# Patient Record
Sex: Female | Born: 1965 | Race: White | Hispanic: No | Marital: Single | Smoking: Current every day smoker
Health system: Southern US, Community
[De-identification: ages and names within clinical notes are randomized; demographics above are authoritative.]

## PROBLEM LIST (undated history)

## (undated) DIAGNOSIS — K219 Gastro-esophageal reflux disease without esophagitis: Secondary | ICD-10-CM

## (undated) DIAGNOSIS — F419 Anxiety disorder, unspecified: Secondary | ICD-10-CM

## (undated) DIAGNOSIS — K5900 Constipation, unspecified: Secondary | ICD-10-CM

## (undated) HISTORY — DX: Gastro-esophageal reflux disease without esophagitis: K21.9

## (undated) HISTORY — DX: Constipation, unspecified: K59.00

## (undated) HISTORY — DX: Anxiety disorder, unspecified: F41.9

---

## 1995-10-26 HISTORY — PX: APPENDECTOMY: SHX54

## 1995-10-26 HISTORY — PX: OVARY SURGERY: SHX727

## 2001-04-21 ENCOUNTER — Encounter: Payer: Self-pay | Admitting: Emergency Medicine

## 2001-04-21 ENCOUNTER — Emergency Department (HOSPITAL_COMMUNITY): Admission: EM | Admit: 2001-04-21 | Discharge: 2001-04-21 | Payer: Self-pay | Admitting: Emergency Medicine

## 2001-07-01 ENCOUNTER — Ambulatory Visit (HOSPITAL_COMMUNITY): Admission: RE | Admit: 2001-07-01 | Discharge: 2001-07-01 | Payer: Self-pay | Admitting: Pulmonary Disease

## 2001-07-13 ENCOUNTER — Ambulatory Visit (HOSPITAL_COMMUNITY): Admission: RE | Admit: 2001-07-13 | Discharge: 2001-07-13 | Payer: Self-pay | Admitting: Orthopaedic Surgery

## 2001-07-13 ENCOUNTER — Encounter: Payer: Self-pay | Admitting: Orthopaedic Surgery

## 2001-12-13 ENCOUNTER — Encounter: Payer: Self-pay | Admitting: Orthopaedic Surgery

## 2001-12-13 ENCOUNTER — Ambulatory Visit (HOSPITAL_COMMUNITY): Admission: RE | Admit: 2001-12-13 | Discharge: 2001-12-13 | Payer: Self-pay | Admitting: Orthopaedic Surgery

## 2002-01-04 ENCOUNTER — Other Ambulatory Visit: Admission: RE | Admit: 2002-01-04 | Discharge: 2002-01-04 | Payer: Self-pay | Admitting: Internal Medicine
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
## 2003-11-12 ENCOUNTER — Ambulatory Visit (HOSPITAL_COMMUNITY): Admission: RE | Admit: 2003-11-12 | Discharge: 2003-11-12 | Payer: Self-pay | Admitting: Pulmonary Disease

## 2009-02-22 ENCOUNTER — Emergency Department (HOSPITAL_COMMUNITY): Admission: EM | Admit: 2009-02-22 | Discharge: 2009-02-22 | Payer: Self-pay | Admitting: Emergency Medicine

## 2009-08-11 ENCOUNTER — Emergency Department (HOSPITAL_COMMUNITY): Admission: EM | Admit: 2009-08-11 | Discharge: 2009-08-11 | Payer: Self-pay | Admitting: Emergency Medicine

## 2016-07-02 ENCOUNTER — Other Ambulatory Visit (HOSPITAL_COMMUNITY)
Admission: RE | Admit: 2016-07-02 | Discharge: 2016-07-02 | Disposition: A | Discharge status: Home / Self Care | Payer: 59 | Source: Ambulatory Visit | Attending: Adult Health | Admitting: Adult Health

## 2016-07-02 ENCOUNTER — Encounter: Payer: Self-pay | Admitting: Adult Health

## 2016-07-02 ENCOUNTER — Ambulatory Visit (INDEPENDENT_AMBULATORY_CARE_PROVIDER_SITE_OTHER): Payer: 59 | Admitting: Adult Health

## 2016-07-02 VITALS — BP 120/80 | HR 86 | Ht 65.0 in | Wt 133.0 lb

## 2016-07-02 DIAGNOSIS — Z01411 Encounter for gynecological examination (general) (routine) with abnormal findings: Secondary | ICD-10-CM

## 2016-07-02 DIAGNOSIS — K5900 Constipation, unspecified: Secondary | ICD-10-CM | POA: Diagnosis not present

## 2016-07-02 DIAGNOSIS — Z01419 Encounter for gynecological examination (general) (routine) without abnormal findings: Secondary | ICD-10-CM | POA: Insufficient documentation

## 2016-07-02 DIAGNOSIS — Z1151 Encounter for screening for human papillomavirus (HPV): Secondary | ICD-10-CM | POA: Diagnosis present

## 2016-07-02 DIAGNOSIS — Z1211 Encounter for screening for malignant neoplasm of colon: Secondary | ICD-10-CM

## 2016-07-02 DIAGNOSIS — R1011 Right upper quadrant pain: Secondary | ICD-10-CM | POA: Diagnosis not present

## 2016-07-02 DIAGNOSIS — N951 Menopausal and female climacteric states: Secondary | ICD-10-CM

## 2016-07-02 LAB — HEMOCCULT GUIAC POC 1CARD (OFFICE): FECAL OCCULT BLD: NEGATIVE

## 2016-07-02 MED ORDER — POLYETHYLENE GLYCOL 3350 17 G PO PACK: 17.0000 g | PACK | Freq: Every day | ORAL | 0 refills | Status: DC

## 2016-07-02 MED ORDER — OMEPRAZOLE 20 MG PO CPDR: 20.0000 mg | DELAYED_RELEASE_CAPSULE | Freq: Two times a day (BID) | ORAL | 3 refills | Status: DC

## 2016-07-02 NOTE — Progress Notes (Signed)
Patient ID: Carmen Kelley, female   DOB: 03-13-1966, 50 y.o.   MRN: [id number] History of Present Illness:  Carmen Kelley is a 50 year old white female, new to this practice in for a well woman gyn exam and pap.She is having irregular periods, only 3 this year and some hot flashes, constipation and pin under right ribs at times.She has taken zantac and that helps some.Has gained some weight, esp mid section.  Current Medications, Allergies, Past Medical History, Past Surgical History, Family History and Social History were reviewed in [employer] record.     Review of Systems:  Patient denies any headaches, hearing loss, fatigue, blurred vision, shortness of breath, chest pain, problems with urination, or intercourse(not currently having sex). No joint pain or mood swings. See HPI for positives.   Physical Exam:BP 120/80 (BP Location: Left Arm, Patient Position: Sitting, Cuff Size: Normal)   Pulse 86   Ht 5\' 5"  (1.651 m)   Wt 133 lb (60.3 kg)   BMI 22.13 kg/m  General:  Well developed, well nourished, no acute distress Skin:  Warm and dry Neck:  Midline trachea, normal thyroid, good ROM, no lymphadenopathy Lungs; Clear to auscultation bilaterally Breast:  No dominant palpable mass, retraction, or nipple discharge Cardiovascular: Regular rate and rhythm Abdomen:  Soft, non tender, no hepatosplenomegaly Pelvic:  External genitalia is normal in appearance, no lesions.  The vagina is normal in appearance. Urethra has no lesions or masses. The cervix is smooth, pap with HPV performed.  Uterus is felt to be normal size, shape, and contour.  No adnexal masses or tenderness noted.Bladder is non tender, no masses felt. Rectal: Good sphincter tone, no polyps, or hemorrhoids felt.  Hemoccult negative. Extremities/musculoskeletal:  No swelling or varicosities noted, no clubbing or cyanosis Psych:  No mood changes, alert and cooperative,seems happy Discussed menopause  changes.  Impression: 1. Encounter for gynecological examination with Papanicolaou smear of cervix   2. Peri-menopausal   3. Constipation, unspecified constipation type   4. RUQ pain       Plan: Check CBC,CMP,TSH and lipids,A1c and vitamin D and H pylori,will talk next week, may get [place] to assess pain Take miralax daily Rx prilosec 20 mg 1 bid #60 with 3 refills Physical in 1 year, pap in 3 if normal Mammogram now and yearly Colonoscopy at 42 Review handout on peri menopause and menopause

## 2016-07-02 NOTE — Patient Instructions (Signed)
Menopause Menopause is the normal time of life when menstrual periods stop completely. Menopause is complete when you have missed 12 consecutive menstrual periods. It usually occurs between the ages of 48 years and 55 years. Very rarely does a woman develop menopause before the age of 40 years. At menopause, your ovaries stop producing the female hormones estrogen and progesterone. This can cause undesirable symptoms and also affect your health. Sometimes the symptoms may occur 4-5 years before the menopause begins. There is no relationship between menopause and:  Oral contraceptives.  Number of children you had.  Race.  The age your menstrual periods started (menarche). Heavy smokers and very thin women may develop menopause earlier in life. CAUSES  The ovaries stop producing the female hormones estrogen and progesterone.  Other causes include:  Surgery to remove both ovaries.  The ovaries stop functioning for no known reason.  Tumors of the pituitary gland in the brain.  Medical disease that affects the ovaries and hormone production.  Radiation treatment to the abdomen or pelvis.  Chemotherapy that affects the ovaries. SYMPTOMS   Hot flashes.  Night sweats.  Decrease in sex drive.  Vaginal dryness and thinning of the vagina causing painful intercourse.  Dryness of the skin and developing wrinkles.  Headaches.  Tiredness.  Irritability.  Memory problems.  Weight gain.  Bladder infections.  Hair growth of the face and chest.  Infertility. More serious symptoms include:  Loss of bone (osteoporosis) causing breaks (fractures).  Depression.  Hardening and narrowing of the arteries (atherosclerosis) causing heart attacks and strokes. DIAGNOSIS   When the menstrual periods have stopped for 12 straight months.  Physical exam.  Hormone studies of the blood. TREATMENT  There are many treatment choices and nearly as many questions about them. The  decisions to treat or not to treat menopausal changes is an individual choice made with your health care provider. Your health care provider can discuss the treatments with you. Together, you can decide which treatment will work best for you. Your treatment choices may include:   Hormone therapy (estrogen and progesterone).  Non-hormonal medicines.  Treating the individual symptoms with medicine (for example antidepressants for depression).  Herbal medicines that may help specific symptoms.  Counseling by a psychiatrist or psychologist.  Group therapy.  Lifestyle changes including:  Eating healthy.  Regular exercise.  Limiting caffeine and alcohol.  Stress management and meditation.  No treatment. HOME CARE INSTRUCTIONS   Take the medicine your health care provider gives you as directed.  Get plenty of sleep and rest.  Exercise regularly.  Eat a diet that contains calcium (good for the bones) and soy products (acts like estrogen hormone).  Avoid alcoholic beverages.  Do not smoke.  If you have hot flashes, dress in layers.  Take supplements, calcium, and vitamin D to strengthen bones.  You can use over-the-counter lubricants or moisturizers for vaginal dryness.  Group therapy is sometimes very helpful.  Acupuncture may be helpful in some cases. SEEK MEDICAL CARE IF:   You are not sure you are in menopause.  You are having menopausal symptoms and need advice and treatment.  You are still having menstrual periods after age 55 years.  You have pain with intercourse.  Menopause is complete (no menstrual period for 12 months) and you develop vaginal bleeding.  You need a referral to a specialist (gynecologist, psychiatrist, or psychologist) for treatment. SEEK IMMEDIATE MEDICAL CARE IF:   You have severe depression.  You have excessive vaginal bleeding.    You fell and think you have a broken bone.  You have pain when you urinate.  You develop leg or  chest pain.  You have a fast pounding heart beat (palpitations).  You have severe headaches.  You develop vision problems.  You feel a lump in your breast.  You have abdominal pain or severe indigestion.   This information is not intended to replace advice given to you by your health care provider. Make sure you discuss any questions you have with your health care provider.   Document Released: 01/01/2004 Document Revised: 06/13/2013 Document Reviewed: 05/10/2013 Elsevier Interactive Patient Education 2016 [place] is the time when your body begins to move into the menopause (no menstrual period for 12 straight months). It is a natural process. Perimenopause can begin 2-8 years before the menopause and usually lasts for 1 year after the menopause. During this time, your ovaries may or may not produce an egg. The ovaries vary in their production of estrogen and progesterone hormones each month. This can cause irregular menstrual periods, difficulty getting pregnant, vaginal bleeding between periods, and uncomfortable symptoms. CAUSES  Irregular production of the ovarian hormones, estrogen and progesterone, and not ovulating every month.  Other causes include:  Tumor of the pituitary gland in the brain.  Medical disease that affects the ovaries.  Radiation treatment.  Chemotherapy.  Unknown causes.  Heavy smoking and excessive alcohol intake can bring on perimenopause sooner. SIGNS AND SYMPTOMS   Hot flashes.  Night sweats.  Irregular menstrual periods.  Decreased sex drive.  Vaginal dryness.  Headaches.  Mood swings.  Depression.  Memory problems.  Irritability.  Tiredness.  Weight gain.  Trouble getting pregnant.  The beginning of losing bone cells (osteoporosis).  The beginning of hardening of the arteries (atherosclerosis). DIAGNOSIS  Your health care provider will make a diagnosis by analyzing your age, menstrual  history, and symptoms. He or she will do a physical exam and note any changes in your body, especially your female organs. Female hormone tests may or may not be helpful depending on the amount of female hormones you produce and when you produce them. However, other hormone tests may be helpful to rule out other problems. TREATMENT  In some cases, no treatment is needed. The decision on whether treatment is necessary during the perimenopause should be made by you and your health care provider based on how the symptoms are affecting you and your lifestyle. Various treatments are available, such as:  Treating individual symptoms with a specific medicine for that symptom.  Herbal medicines that can help specific symptoms.  Counseling.  Group therapy. HOME CARE INSTRUCTIONS   Keep track of your menstrual periods (when they occur, how heavy they are, how long between periods, and how long they last) as well as your symptoms and when they started.  Only take over-the-counter or prescription medicines as directed by your health care provider.  Sleep and rest.  Exercise.  Eat a diet that contains calcium (good for your bones) and soy (acts like the estrogen hormone).  Do not smoke.  Avoid alcoholic beverages.  Take vitamin supplements as recommended by your health care provider. Taking vitamin E may help in certain cases.  Take calcium and vitamin D supplements to help prevent bone loss.  Group therapy is sometimes helpful.  Acupuncture may help in some cases. SEEK MEDICAL CARE IF:   You have questions about any symptoms you are having.  You need a referral to a specialist (gynecologist,  psychiatrist, or psychologist). SEEK IMMEDIATE MEDICAL CARE IF:   You have vaginal bleeding.  Your period lasts longer than 8 days.  Your periods are recurring sooner than 21 days.  You have bleeding after intercourse.  You have severe depression.  You have pain when you urinate.  You  have severe headaches.  You have vision problems.   This information is not intended to replace advice given to you by your health care provider. Make sure you discuss any questions you have with your health care provider.   Document Released: 11/18/2004 Document Revised: 11/01/2014 Document Reviewed: 05/10/2013 Elsevier Interactive Patient Education [employer]. Physical in 1 year Mammogram now  Will talk next week

## 2016-07-03 LAB — CBC
Hematocrit: 41.2 % (ref 34.0–46.6)
Hemoglobin: 14.1 g/dL (ref 11.1–15.9)
MCH: 32.1 pg (ref 26.6–33.0)
MCHC: 34.2 g/dL (ref 31.5–35.7)
MCV: 94 fL (ref 79–97)
PLATELETS: 314 10*3/uL (ref 150–379)
RBC: 4.39 x10E6/uL (ref 3.77–5.28)
RDW: 12.6 % (ref 12.3–15.4)
WBC: 10.2 10*3/uL (ref 3.4–10.8)

## 2016-07-03 LAB — LIPID PANEL
CHOLESTEROL TOTAL: 203 mg/dL — AB (ref 100–199)
Chol/HDL Ratio: 3.3 ratio units (ref 0.0–4.4)
HDL: 62 mg/dL (ref >39)
LDL Calculated: 124 mg/dL — ABNORMAL HIGH (ref 0–99)
TRIGLYCERIDES: 86 mg/dL (ref 0–149)
VLDL Cholesterol Cal: 17 mg/dL (ref 5–40)

## 2016-07-03 LAB — COMPREHENSIVE METABOLIC PANEL
A/G RATIO: 1.7 (ref 1.2–2.2)
ALT: 16 IU/L (ref 0–32)
AST: 25 IU/L (ref 0–40)
Albumin: 4.4 g/dL (ref 3.5–5.5)
Alkaline Phosphatase: 81 IU/L (ref 39–117)
BILIRUBIN TOTAL: 0.3 mg/dL (ref 0.0–1.2)
BUN/Creatinine Ratio: 17 (ref 9–23)
BUN: 11 mg/dL (ref 6–24)
CALCIUM: 9.9 mg/dL (ref 8.7–10.2)
CHLORIDE: 101 mmol/L (ref 96–106)
CO2: 23 mmol/L (ref 18–29)
Creatinine, Ser: 0.66 mg/dL (ref 0.57–1.00)
GFR calc Af Amer: 120 mL/min/{1.73_m2} (ref >59)
GFR calc non Af Amer: 104 mL/min/{1.73_m2} (ref >59)
GLUCOSE: 88 mg/dL (ref 65–99)
Globulin, Total: 2.6 g/dL (ref 1.5–4.5)
POTASSIUM: 4.9 mmol/L (ref 3.5–5.2)
Sodium: 141 mmol/L (ref 134–144)
Total Protein: 7 g/dL (ref 6.0–8.5)

## 2016-07-03 LAB — TSH: TSH: 1.98 u[IU]/mL (ref 0.450–4.500)

## 2016-07-03 LAB — VITAMIN D 25 HYDROXY (VIT D DEFICIENCY, FRACTURES): VIT D 25 HYDROXY: 66.4 ng/mL (ref 30.0–100.0)

## 2016-07-03 LAB — H. PYLORI ANTIBODY, IGG: H PYLORI IGG: 7.8 U/mL — AB (ref 0.0–0.8)

## 2016-07-03 LAB — HEMOGLOBIN A1C
Est. average glucose Bld gHb Est-mCnc: 97 mg/dL
Hgb A1c MFr Bld: 5 % (ref 4.8–5.6)

## 2016-07-05 LAB — CYTOLOGY - PAP

## 2016-07-07 ENCOUNTER — Telehealth: Payer: Self-pay | Admitting: Adult Health

## 2016-07-07 NOTE — Telephone Encounter (Signed)
Left message to call me tomorrow about labs  

## 2016-07-08 ENCOUNTER — Telehealth: Payer: Self-pay | Admitting: Adult Health

## 2016-07-08 MED ORDER — AMOXICILL-CLARITHRO-LANSOPRAZ PO MISC: Freq: Two times a day (BID) | ORAL | 0 refills | Status: DC

## 2016-07-08 MED ORDER — METRONIDAZOLE 500 MG PO TABS: 500.0000 mg | ORAL_TABLET | Freq: Two times a day (BID) | ORAL | 0 refills | Status: DC

## 2016-07-08 NOTE — Telephone Encounter (Signed)
Pt aware +H pylori 7.8. Other labs look good, and pap negative with -HPV but +BV, called in Prevpac and flagyl

## 2016-08-18 ENCOUNTER — Telehealth: Payer: Self-pay | Admitting: Adult Health

## 2016-08-18 NOTE — Telephone Encounter (Signed)
Spoke with pt letting her know she saw JAG 07/02/16 and had TSH checked which was normal. Pt states she is having hot flashes. I advised she could make an appt to discuss with JAG. Pt agreed and call was transferred to front desk. [place]

## 2016-09-09 ENCOUNTER — Telehealth: Payer: Self-pay | Admitting: *Deleted

## 2016-09-10 ENCOUNTER — Ambulatory Visit: Payer: 59 | Admitting: Adult Health

## 2016-09-10 NOTE — Telephone Encounter (Signed)
Left message needs appt to discuss hormones

## 2017-04-01 ENCOUNTER — Ambulatory Visit (INDEPENDENT_AMBULATORY_CARE_PROVIDER_SITE_OTHER): Payer: 59 | Admitting: Adult Health

## 2017-04-01 ENCOUNTER — Encounter: Payer: Self-pay | Admitting: Adult Health

## 2017-04-01 VITALS — BP 104/60 | HR 64 | Ht 65.0 in | Wt 129.0 lb

## 2017-04-01 DIAGNOSIS — N951 Menopausal and female climacteric states: Secondary | ICD-10-CM

## 2017-04-01 DIAGNOSIS — F489 Nonpsychotic mental disorder, unspecified: Secondary | ICD-10-CM | POA: Diagnosis not present

## 2017-04-01 DIAGNOSIS — R4589 Other symptoms and signs involving emotional state: Secondary | ICD-10-CM

## 2017-04-01 DIAGNOSIS — Z79890 Hormone replacement therapy (postmenopausal): Secondary | ICD-10-CM | POA: Diagnosis not present

## 2017-04-01 MED ORDER — CONJ ESTROGENS-BAZEDOXIFENE 0.45-20 MG PO TABS: ORAL_TABLET | ORAL | 0 refills | Status: DC

## 2017-04-01 MED ORDER — OMEPRAZOLE 20 MG PO CPDR: 20.0000 mg | DELAYED_RELEASE_CAPSULE | Freq: Every day | ORAL | 6 refills | Status: DC

## 2017-04-01 NOTE — Progress Notes (Signed)
Subjective:     Patient ID: Carmen Kelley, female   DOB: May 27, 1966, 51 y.o.   MRN: [id number]  HPI Carmen Kelley is a 51 year old white female, in complaining of being, moody,anxious, teary and some night flashes, and can't concentrate, and no period in about 8 months.Has made mistakes at work. Was treated for +H pylori feels better.  Review of Systems +moody Some Hot flashes Can't concentrate as well, making mistakes Teary at times Feels anxious No period in about 8 months Stomach better  Reviewed past medical,surgical, social and family history. Reviewed medications and allergies.     Objective:   Physical Exam BP 104/60 (BP Location: Left Arm, Patient Position: Sitting, Cuff Size: Normal)   Pulse 64   Ht 5\' 5"  (1.651 m)   Wt 129 lb (58.5 kg)   BMI 21.47 kg/m  Skin warm and dry.  Lungs: clear to ausculation bilaterally. Cardiovascular: regular rate and rhythm.   PHQ 2 score 1. Discussed could try HRT, she is aware of risks and benefits and wants to try.  Assessment:     1. Peri-menopausal   2. Moody   3. Feeling anxious   4. Hormone replacement therapy (HRT)       Plan:    Refilled prilosec 20 mg take 1 daily #30 with 6 refills  Meds ordered this encounter  Medications  . Conj Estrogens-Bazedoxifene (DUAVEE) 0.45-20 MG TABS    Sig: Take 1 daily    Dispense:  28 tablet    Refill:  0    Order Specific Question:   Supervising Provider    Answer:   Elonda Husky, LUTHER H [[id number]]  Follow up in 3 weeks for ROS

## 2017-04-22 ENCOUNTER — Encounter: Payer: Self-pay | Admitting: Adult Health

## 2017-04-22 ENCOUNTER — Ambulatory Visit (INDEPENDENT_AMBULATORY_CARE_PROVIDER_SITE_OTHER): Payer: 59 | Admitting: Adult Health

## 2017-04-22 VITALS — BP 100/60 | HR 76 | Ht 64.0 in | Wt 129.0 lb

## 2017-04-22 DIAGNOSIS — Z79890 Hormone replacement therapy (postmenopausal): Secondary | ICD-10-CM | POA: Diagnosis not present

## 2017-04-22 MED ORDER — CONJ ESTROGENS-BAZEDOXIFENE 0.45-20 MG PO TABS: ORAL_TABLET | ORAL | 0 refills | Status: DC

## 2017-04-22 NOTE — Progress Notes (Addendum)
Subjective:     Patient ID: Carmen Kelley, female   DOB: 07-13-1966, 51 y.o.   MRN: [id number]  HPI Carmen Kelley is a 51 year old white female, back in follow up of starting [facility] 04/01/17.She says she is a little better, not has moody or anxious, fewer flashes, concentration, may be better too.But was writen up this weak over mistake in May, so this week not as good.   Review of Systems  Not has moody or anxious, fewer flashes, concentration, may be better too.  Reviewed past medical,surgical, social and family history. Reviewed medications and allergies.     Objective:   Physical Exam BP 100/60 (BP Location: Left Arm, Patient Position: Sitting, Cuff Size: Small)   Pulse 76   Ht 5\' 4"  (1.626 m)   Wt 129 lb (58.5 kg)   BMI 22.14 kg/m  Skin warm and dry.  Lungs: clear to ausculation bilaterally. Cardiovascular: regular rate and rhythm.   Will continue Duvaee, declines SSRI.Try to decrease cigarettes.  Assessment:     HRT    Plan:    Continue Duavee, take 1 daily ,#56 tabs given, call for refill F/U in 3 months

## 2017-04-25 ENCOUNTER — Encounter: Payer: Self-pay | Admitting: Adult Health

## 2017-06-28 ENCOUNTER — Telehealth: Payer: Self-pay | Admitting: *Deleted

## 2017-06-28 NOTE — Telephone Encounter (Signed)
Left message that I left samples at front desk of the [facility]

## 2017-07-22 ENCOUNTER — Telehealth: Payer: Self-pay | Admitting: Adult Health

## 2017-07-22 ENCOUNTER — Ambulatory Visit: Payer: 59 | Admitting: Adult Health

## 2017-07-22 NOTE — Telephone Encounter (Signed)
Left message I Called to apologize for delay and her having to reschedule, will see 08/05/17

## 2017-08-05 ENCOUNTER — Ambulatory Visit: Payer: 59 | Admitting: Adult Health

## 2017-09-02 ENCOUNTER — Ambulatory Visit: Payer: 59 | Admitting: Adult Health

## 2017-09-02 ENCOUNTER — Encounter: Payer: Self-pay | Admitting: Adult Health

## 2017-09-02 VITALS — BP 110/80 | HR 96 | Ht 64.0 in | Wt 131.0 lb

## 2017-09-02 DIAGNOSIS — Z1212 Encounter for screening for malignant neoplasm of rectum: Secondary | ICD-10-CM | POA: Diagnosis not present

## 2017-09-02 DIAGNOSIS — Z79890 Hormone replacement therapy (postmenopausal): Secondary | ICD-10-CM | POA: Diagnosis not present

## 2017-09-02 DIAGNOSIS — Z1211 Encounter for screening for malignant neoplasm of colon: Secondary | ICD-10-CM

## 2017-09-02 DIAGNOSIS — N951 Menopausal and female climacteric states: Secondary | ICD-10-CM

## 2017-09-02 DIAGNOSIS — Z01419 Encounter for gynecological examination (general) (routine) without abnormal findings: Secondary | ICD-10-CM | POA: Diagnosis not present

## 2017-09-02 DIAGNOSIS — R4589 Other symptoms and signs involving emotional state: Secondary | ICD-10-CM | POA: Insufficient documentation

## 2017-09-02 LAB — HEMOCCULT GUIAC POC 1CARD (OFFICE): FECAL OCCULT BLD: NEGATIVE

## 2017-09-02 MED ORDER — OMEPRAZOLE 20 MG PO CPDR: 20.0000 mg | DELAYED_RELEASE_CAPSULE | Freq: Every day | ORAL | 12 refills | Status: DC

## 2017-09-02 MED ORDER — CONJ ESTROGENS-BAZEDOXIFENE 0.45-20 MG PO TABS: ORAL_TABLET | ORAL | 12 refills | Status: DC

## 2017-09-02 NOTE — Progress Notes (Signed)
Patient ID: Carmen Kelley, female   DOB: 06-18-1966, 51 y.o.   MRN: [id number] History of Present Illness: Carmen Kelley is a 51 year old white female, single in for well woman physical and GYN exam, she had a normal pap with negative HPV 07/02/16.    Current Medications, Allergies, Past Medical History, Past Surgical History, Family History and Social History were reviewed in [employer] record.     Review of Systems:  Patient denies any headaches, hearing loss, fatigue, blurred vision, shortness of breath, chest pain, abdominal pain, problems with bowel movements, urination, or intercourse(not having sex). No joint pain or mood swings.   Physical Exam:BP 110/80 (BP Location: Left Arm, Patient Position: Sitting, Cuff Size: Normal)   Pulse 96   Ht 5\' 4"  (1.626 m)   Wt 131 lb (59.4 kg)   BMI 22.49 kg/m  General:  Well developed, well nourished, no acute distress Skin:  Warm and dry Neck:  Midline trachea, normal thyroid, good ROM, no lymphadenopathy Lungs; Clear to auscultation bilaterally Breast:  No dominant palpable mass, retraction, or nipple discharge Cardiovascular: Regular rate and rhythm Abdomen:  Soft, non tender, no hepatosplenomegaly Pelvic:  External genitalia is normal in appearance, no lesions.  The vagina is normal in appearance. Urethra has no lesions or masses. The cervix is smooth  Uterus is felt to be normal size, shape, and contour.  No adnexal masses or tenderness noted.Bladder is non tender, no masses felt. Rectal: Good sphincter tone, no polyps, or hemorrhoids felt.  Hemoccult negative. Extremities/musculoskeletal:  No swelling or varicosities noted, no clubbing or cyanosis Psych:  No mood changes, alert and cooperative,seems happy PHQ 9 score 6, is on Duavee and it helps, denies being suicidal.  Impression: 1. Encounter for well woman exam with routine gynecological exam   2. Hormone replacement therapy (HRT)   3. Screening for colorectal cancer    4. Moody   5. Peri-menopausal       Plan: Check CBC,CMP,TSH and lipids,HIV Meds ordered this encounter  Medications  . Conj Estrogens-Bazedoxifene (DUAVEE) 0.45-20 MG TABS    Sig: Take 1 daily    Dispense:  30 tablet    Refill:  12    Order Specific Question:   Supervising Provider    Answer:   Elonda Husky, LUTHER H [[id number]]  . omeprazole (PRILOSEC) 20 MG capsule    Sig: Take 1 capsule (20 mg total) daily by mouth.    Dispense:  30 capsule    Refill:  12    Order Specific Question:   Supervising Provider    Answer:   Florian Buff [[id number]]  Physical in 1 year Pap in 2020 Mammogram yearly Referred to [facility] Dr Oneida Alar for colonoscopy

## 2017-09-03 LAB — COMPREHENSIVE METABOLIC PANEL
ALBUMIN: 4.4 g/dL (ref 3.5–5.5)
ALT: 19 IU/L (ref 0–32)
AST: 29 IU/L (ref 0–40)
Albumin/Globulin Ratio: 2 (ref 1.2–2.2)
Alkaline Phosphatase: 77 IU/L (ref 39–117)
BUN / CREAT RATIO: 14 (ref 9–23)
BUN: 9 mg/dL (ref 6–24)
Bilirubin Total: 0.2 mg/dL (ref 0.0–1.2)
CALCIUM: 10.1 mg/dL (ref 8.7–10.2)
CO2: 23 mmol/L (ref 20–29)
CREATININE: 0.66 mg/dL (ref 0.57–1.00)
Chloride: 104 mmol/L (ref 96–106)
GFR calc Af Amer: 119 mL/min/{1.73_m2} (ref >59)
GFR, EST NON AFRICAN AMERICAN: 103 mL/min/{1.73_m2} (ref >59)
GLOBULIN, TOTAL: 2.2 g/dL (ref 1.5–4.5)
GLUCOSE: 84 mg/dL (ref 65–99)
Potassium: 4.5 mmol/L (ref 3.5–5.2)
SODIUM: 140 mmol/L (ref 134–144)
Total Protein: 6.6 g/dL (ref 6.0–8.5)

## 2017-09-03 LAB — LIPID PANEL
CHOLESTEROL TOTAL: 171 mg/dL (ref 100–199)
Chol/HDL Ratio: 3.1 ratio (ref 0.0–4.4)
HDL: 56 mg/dL (ref >39)
LDL CALC: 89 mg/dL (ref 0–99)
Triglycerides: 130 mg/dL (ref 0–149)
VLDL CHOLESTEROL CAL: 26 mg/dL (ref 5–40)

## 2017-09-03 LAB — TSH: TSH: 1.74 u[IU]/mL (ref 0.450–4.500)

## 2017-09-03 LAB — CBC
HEMATOCRIT: 43.6 % (ref 34.0–46.6)
HEMOGLOBIN: 14.4 g/dL (ref 11.1–15.9)
MCH: 32 pg (ref 26.6–33.0)
MCHC: 33 g/dL (ref 31.5–35.7)
MCV: 97 fL (ref 79–97)
Platelets: 331 10*3/uL (ref 150–379)
RBC: 4.5 x10E6/uL (ref 3.77–5.28)
RDW: 13.3 % (ref 12.3–15.4)
WBC: 9.8 10*3/uL (ref 3.4–10.8)

## 2017-09-03 LAB — HIV ANTIBODY (ROUTINE TESTING W REFLEX): HIV Screen 4th Generation wRfx: NONREACTIVE

## 2017-09-05 ENCOUNTER — Encounter: Payer: Self-pay | Admitting: Adult Health

## 2017-10-07 ENCOUNTER — Telehealth: Payer: Self-pay | Admitting: *Deleted

## 2017-10-07 NOTE — Telephone Encounter (Signed)
Informed patient we do not have any samples of Duavee. Discount card given.

## 2017-10-28 ENCOUNTER — Telehealth: Payer: Self-pay | Admitting: Obstetrics & Gynecology

## 2018-02-01 ENCOUNTER — Telehealth: Payer: Self-pay | Admitting: *Deleted

## 2018-02-01 ENCOUNTER — Encounter: Payer: Self-pay | Admitting: Adult Health

## 2018-02-01 NOTE — Telephone Encounter (Signed)
Duavee sample given to patient.  Lot # [id number] 06/2019 exp

## 2018-03-24 ENCOUNTER — Other Ambulatory Visit: Payer: Self-pay | Admitting: Women's Health

## 2018-03-24 ENCOUNTER — Encounter: Payer: Self-pay | Admitting: Adult Health

## 2018-03-24 MED ORDER — PANTOPRAZOLE SODIUM 20 MG PO TBEC: 20.0000 mg | DELAYED_RELEASE_TABLET | Freq: Every day | ORAL | 0 refills | Status: DC

## 2018-03-24 NOTE — Progress Notes (Signed)
Pt sent mychart message:   From: Carmen Kelley  Sent: 03/24/2018  6:32 AM  To: [facility]  Subject: Non-Urgent Medical Question             ----- Message from Mychart, Generic sent at 03/24/2018 6:32 AM EDT -----    Good morning, [employer] sent me a notice they will not cover my omeprazole anymore. Will you please call me in something else to [facility]. I'm out of these   Rx protonix Roma Schanz, CNM, [facility] 03/24/2018 11:33 AM

## 2018-06-28 ENCOUNTER — Telehealth: Payer: Self-pay | Admitting: Adult Health

## 2018-06-28 NOTE — Telephone Encounter (Signed)
LMOVM Duavee samples left at front desk.

## 2018-06-28 NOTE — Telephone Encounter (Signed)
Patient called Pt would like to know if she have samples of DUAVEE, Please contact pt

## 2018-08-24 ENCOUNTER — Ambulatory Visit (INDEPENDENT_AMBULATORY_CARE_PROVIDER_SITE_OTHER): Payer: Self-pay

## 2018-08-24 DIAGNOSIS — Z1211 Encounter for screening for malignant neoplasm of colon: Secondary | ICD-10-CM

## 2018-08-24 MED ORDER — NA SULFATE-K SULFATE-MG SULF 17.5-3.13-1.6 GM/177ML PO SOLN: 1.0000 | ORAL | 0 refills | Status: DC

## 2018-08-24 NOTE — Progress Notes (Signed)
Gastroenterology Pre-Procedure Review  Request Date:08/24/18 Requesting Physician: Lanelle Bal PA [facility]- no previous tcs  PATIENT REVIEW QUESTIONS: The patient responded to the following health history questions as indicated:    1. Diabetes Melitis: no 2. Joint replacements in the past 12 months: no 3. Major health problems in the past 3 months: no 4. Has an artificial valve or MVP: no 5. Has a defibrillator: no 6. Has been advised in past to take antibiotics in advance of a procedure like teeth cleaning: no 7. Family history of colon cancer: no  8. Alcohol Use: yes (occ 9. History of sleep apnea: no  10. History of coronary artery or other vascular stents placed within the last 12 months: no 11. History of any prior anesthesia complications: no    MEDICATIONS & ALLERGIES:    Patient reports the following regarding taking any blood thinners:   Plavix? no Aspirin? no Coumadin? no Brilinta? no Xarelto? no Eliquis? no Pradaxa? no Savaysa? no Effient? no  Patient confirms/reports the following medications:  Current Outpatient Medications  Medication Sig Dispense Refill  . Aspirin-Acetaminophen-Caffeine (GOODY HEADACHE PO) Take by mouth 3 (three) times daily.    Marland Kitchen Conj Estrogens-Bazedoxifene (DUAVEE) 0.45-20 MG TABS Take 1 daily 30 tablet 12  . pantoprazole (PROTONIX) 20 MG tablet Take 1 tablet (20 mg total) by mouth daily. 30 tablet 0  . polyethylene glycol (MIRALAX / GLYCOLAX) packet Take 17 g by mouth daily. (Patient taking differently: Take 17 g by mouth as needed. ) 14 each 0   No current facility-administered medications for this visit.     Patient confirms/reports the following allergies:  No Known Allergies  No orders of the defined types were placed in this encounter.   AUTHORIZATION INFORMATION Primary Insurance: [employer]   ID #: [id number] Pre-Cert / Josem Kaufmann required:  Pre-Cert / Auth #:    SCHEDULE INFORMATION: Procedure has been scheduled as  follows:  Date: 10/13/18, Time: 3:00 Location: [facility] Dr.Rourk  This Gastroenterology Pre-Precedure Review Form is being routed to the following provider(s): Roseanne Kaufman NP

## 2018-08-24 NOTE — Patient Instructions (Addendum)
Carmen Kelley  [birth year] MRN: [id number]     Procedure Date: 12/01/18 Time to register: 12:00pm Place to register: [facility] Procedure Time: 1:00pm Scheduled provider: R. Garfield Cornea, MD    PREPARATION FOR COLONOSCOPY WITH SUPREP BOWEL PREP KIT  Note: Suprep Bowel Prep Kit is a split-dose (2day) regimen. Consumption of BOTH 6-ounce bottles is required for a complete prep.  Please notify us immediately if you are diabetic, take iron supplements, or if you are on Coumadin or any other blood thinners.                                                                                                                                                  2 DAYS BEFORE PROCEDURE:  DATE: 11/29/18   DAY: Wednesday Begin clear liquid diet AFTER your lunch meal. NO SOLID FOODS after this point.  1 DAY BEFORE PROCEDURE:  DATE: 11/30/18  DAY: Thursday Continue clear liquids the entire day - NO SOLID FOOD.   At 6:00pm: Complete steps 1 through 4 below, using ONE (1) 6-ounce bottle, before going to bed. Step 1:  Pour ONE (1) 6-ounce bottle of SUPREP liquid into the mixing container.  Step 2:  Add cool drinking water to the 16 ounce line on the container and mix.  Note: Dilute the solution concentrate as directed prior to use. Step 3:  DRINK ALL the liquid in the container. Step 4:  You MUST drink an additional two (2) or more 16 ounce containers of water over the next one (1) hour.   Continue clear liquids.  DAY OF PROCEDURE:   DATE: 12/01/18  DAY: Friday If you take medications for your heart, blood pressure, or breathing, you may take these medications.   5 hours before your procedure at :8:00am Step 1:  Pour ONE (1) 6-ounce bottle of SUPREP liquid into the mixing container.  Step 2:  Add cool drinking water to the 16 ounce line on the container and mix.  Note: Dilute the solution concentrate as directed prior to use. Step 3:  DRINK ALL the liquid in the container. Step 4:  You MUST  drink an additional two (2) or more 16 ounce containers of water over the next one (1) hour. You MUST complete the final glass of water at least 3 hours before your colonoscopy.   Nothing by mouth past 10:00am  You may take your morning medications with sip of water unless we have instructed otherwise.    Please see below for Dietary Information.  CLEAR LIQUIDS INCLUDE:  Water Jello (NOT red in color)   Ice Popsicles (NOT red in color)   Tea (sugar ok, no milk/cream) Powdered fruit flavored drinks  Coffee (sugar ok, no milk/cream) Gatorade/ Lemonade/ Kool-Aid  (NOT red in color)   Juice: apple, white grape, white cranberry Soft drinks  Clear bullion,  consomme, broth (fat free beef/chicken/vegetable)  Carbonated beverages (any kind)  Strained chicken noodle soup Hard Candy   Remember: Clear liquids are liquids that will allow you to see your fingers on the other side of a clear glass. Be sure liquids are NOT red in color, and not cloudy, but CLEAR.  DO NOT EAT OR DRINK ANY OF THE FOLLOWING:  Dairy products of any kind   Cranberry juice Tomato juice / V8 juice   Grapefruit juice Orange juice     Red grape juice  Do not eat any solid foods, including such foods as: cereal, oatmeal, yogurt, fruits, vegetables, creamed soups, eggs, bread, crackers, pureed foods in a blender, etc.   HELPFUL HINTS FOR DRINKING PREP SOLUTION:   Make sure prep is extremely cold. Mix and refrigerate the the morning of the prep. You may also put in the freezer.   You may try mixing some Crystal Light or Country Time Lemonade if you prefer. Mix in small amounts; add more if necessary.  Try drinking through a straw  Rinse mouth with water or a mouthwash between glasses, to remove after-taste.  Try sipping on a cold beverage /ice/ popsicles between glasses of prep.  Place a piece of sugar-free hard candy in mouth between glasses.  If you become nauseated, try consuming smaller amounts, or stretch out the  time between glasses. Stop for 30-60 minutes, then slowly start back drinking.     OTHER INSTRUCTIONS  You will need a responsible adult at least 51 years of age to accompany you and drive you home. This person must remain in the waiting room during your procedure. The hospital will cancel your procedure if you do not have a responsible adult with you.   1. Wear loose fitting clothing that is easily removed. 2. Leave jewelry and other valuables at home.  3. Remove all body piercing jewelry and leave at home. 4. Total time from sign-in until discharge is approximately 2-3 hours. 5. You should go home directly after your procedure and rest. You can resume normal activities the day after your procedure. 6. The day of your procedure you should not:  Drive  Make legal decisions  Operate machinery  Drink alcohol  Return to work   You may call the office (Dept: [phone]) before 5:00pm, or page the doctor on call [phone]) after 5:00pm, for further instructions, if necessary.   Insurance Information YOU WILL NEED TO CHECK WITH YOUR INSURANCE COMPANY FOR THE BENEFITS OF COVERAGE YOU HAVE FOR THIS PROCEDURE.  UNFORTUNATELY, NOT ALL INSURANCE COMPANIES HAVE BENEFITS TO COVER ALL OR PART OF THESE TYPES OF PROCEDURES.  IT IS YOUR RESPONSIBILITY TO CHECK YOUR BENEFITS, HOWEVER, WE WILL BE GLAD TO ASSIST YOU WITH ANY CODES YOUR INSURANCE COMPANY MAY NEED.    PLEASE NOTE THAT MOST INSURANCE COMPANIES WILL NOT COVER A SCREENING COLONOSCOPY FOR PEOPLE UNDER THE AGE OF 51  IF YOU HAVE BCBS INSURANCE, YOU MAY HAVE BENEFITS FOR A SCREENING COLONOSCOPY BUT IF POLYPS ARE FOUND THE DIAGNOSIS WILL CHANGE AND THEN YOU MAY HAVE A DEDUCTIBLE THAT WILL NEED TO BE MET. SO PLEASE MAKE SURE YOU CHECK YOUR BENEFITS FOR A SCREENING COLONOSCOPY AS WELL AS A DIAGNOSTIC COLONOSCOPY.

## 2018-08-28 NOTE — Progress Notes (Signed)
Appropriate.

## 2018-09-14 ENCOUNTER — Other Ambulatory Visit: Payer: Self-pay | Admitting: Adult Health

## 2018-09-20 ENCOUNTER — Other Ambulatory Visit: Payer: Self-pay | Admitting: Adult Health

## 2018-09-27 ENCOUNTER — Telehealth: Payer: Self-pay | Admitting: Internal Medicine

## 2018-09-27 NOTE — Telephone Encounter (Signed)
Called pt back-LM- informed her that 2/19 was our first available date with [facility]. Asked her to call me back.

## 2018-09-27 NOTE — Telephone Encounter (Signed)
[phone] patient wants to reschedule her appointment for her procedure, she is at work and doesn't answer the phone but wants you to leave a message and she will call you back.Carmen KitchenMarland Kelley

## 2018-09-27 NOTE — Telephone Encounter (Signed)
Pt called back, left a vm-she cannot do either of the dates I gave her, she needs a Friday, I have called her back, LM advised of an appt on 12/01/18 and asked her to call back to confirm.

## 2018-10-03 NOTE — Telephone Encounter (Signed)
Tried to call pt again- LM to call back to verify if 12/01/18 is ok for her procedure.

## 2018-10-04 NOTE — Telephone Encounter (Signed)
Pt called back and LM on my voicemail-  She said 12/01/18 should be ok but she would like to talk to me first before confirming. Pt wants a Friday with [facility] but that is the only Friday available, ( no other Friday appts in Feb/March). She said she will try to call back today to speak with me.

## 2018-10-10 NOTE — Telephone Encounter (Signed)
Tried to call pt- NA- LMOM 

## 2018-10-11 NOTE — Telephone Encounter (Signed)
Tried to call pt again, LM- advised pt that I was going to go ahead and move her tcs from 10/13/18 to 12/01/18 at 1:00. Also advised her that this was the only Friday available in Feb and March and asked her to call back and confirm this date and time so I could send her new instructions.

## 2018-10-12 NOTE — Progress Notes (Signed)
Pt changed date of procedure. See phone note.

## 2018-10-12 NOTE — Telephone Encounter (Signed)
Spoke with the pt, she confirmed the 12/01/18 date was ok. New instructions mailed to the pt.

## 2018-11-16 NOTE — Progress Notes (Signed)
Spoke with Marissa S at [facility]) PA done for tcs and is pending at this time. Ref# [id number]

## 2018-11-29 NOTE — Progress Notes (Addendum)
Per [facility] online, PA has been approved- [id number]

## 2018-12-01 ENCOUNTER — Encounter (HOSPITAL_COMMUNITY)
Admission: RE | Disposition: A | Discharge status: Home / Self Care | Payer: Self-pay | Source: Home / Self Care | Attending: Internal Medicine

## 2018-12-01 ENCOUNTER — Ambulatory Visit (HOSPITAL_COMMUNITY)
Admission: RE | Admit: 2018-12-01 | Discharge: 2018-12-01 | Disposition: A | Discharge status: Home / Self Care | Payer: No Typology Code available for payment source | Attending: Internal Medicine | Admitting: Internal Medicine

## 2018-12-01 ENCOUNTER — Encounter (HOSPITAL_COMMUNITY): Payer: Self-pay | Admitting: *Deleted

## 2018-12-01 ENCOUNTER — Other Ambulatory Visit: Payer: Self-pay

## 2018-12-01 DIAGNOSIS — K5730 Diverticulosis of large intestine without perforation or abscess without bleeding: Secondary | ICD-10-CM | POA: Insufficient documentation

## 2018-12-01 DIAGNOSIS — Z1211 Encounter for screening for malignant neoplasm of colon: Secondary | ICD-10-CM | POA: Insufficient documentation

## 2018-12-01 DIAGNOSIS — K5900 Constipation, unspecified: Secondary | ICD-10-CM | POA: Insufficient documentation

## 2018-12-01 DIAGNOSIS — D128 Benign neoplasm of rectum: Secondary | ICD-10-CM | POA: Insufficient documentation

## 2018-12-01 DIAGNOSIS — K621 Rectal polyp: Secondary | ICD-10-CM | POA: Diagnosis not present

## 2018-12-01 DIAGNOSIS — F17210 Nicotine dependence, cigarettes, uncomplicated: Secondary | ICD-10-CM | POA: Diagnosis not present

## 2018-12-01 DIAGNOSIS — K219 Gastro-esophageal reflux disease without esophagitis: Secondary | ICD-10-CM | POA: Insufficient documentation

## 2018-12-01 DIAGNOSIS — Z79899 Other long term (current) drug therapy: Secondary | ICD-10-CM | POA: Insufficient documentation

## 2018-12-01 DIAGNOSIS — F419 Anxiety disorder, unspecified: Secondary | ICD-10-CM | POA: Insufficient documentation

## 2018-12-01 HISTORY — PX: COLONOSCOPY: SHX5424

## 2018-12-01 HISTORY — PX: POLYPECTOMY: SHX5525

## 2018-12-01 SURGERY — COLONOSCOPY
Anesthesia: Moderate Sedation

## 2018-12-01 MED ORDER — MIDAZOLAM HCL 5 MG/5ML IJ SOLN
INTRAMUSCULAR | Status: AC
  Filled 2018-12-01: qty 10

## 2018-12-01 MED ORDER — ONDANSETRON HCL 4 MG/2ML IJ SOLN
INTRAMUSCULAR | Status: AC
  Filled 2018-12-01: qty 2

## 2018-12-01 MED ORDER — STERILE WATER FOR IRRIGATION IR SOLN
Status: DC | PRN
  Administered 2018-12-01: 13:00:00

## 2018-12-01 MED ORDER — ONDANSETRON HCL 4 MG/2ML IJ SOLN
INTRAMUSCULAR | Status: DC | PRN
  Administered 2018-12-01: 4 mg via INTRAVENOUS

## 2018-12-01 MED ORDER — MIDAZOLAM HCL 5 MG/5ML IJ SOLN
INTRAMUSCULAR | Status: DC | PRN
  Administered 2018-12-01 (×2): 1 mg via INTRAVENOUS
  Administered 2018-12-01 (×2): 2 mg via INTRAVENOUS

## 2018-12-01 MED ORDER — MEPERIDINE HCL 50 MG/ML IJ SOLN
INTRAMUSCULAR | Status: AC
  Filled 2018-12-01: qty 1

## 2018-12-01 MED ORDER — SODIUM CHLORIDE 0.9 % IV SOLN
INTRAVENOUS | Status: DC
  Administered 2018-12-01: 13:00:00 via INTRAVENOUS

## 2018-12-01 MED ORDER — MEPERIDINE HCL 100 MG/ML IJ SOLN
INTRAMUSCULAR | Status: DC | PRN
  Administered 2018-12-01: 25 mg via INTRAVENOUS
  Administered 2018-12-01: 15 mg via INTRAVENOUS

## 2018-12-01 NOTE — Op Note (Signed)
[facility] Patient Name: Carmen Kelley Procedure Date: 12/01/2018 11:03 AM MRN: [id number] Date of Birth: 09-Nov-1965 Attending MD: Norvel Richards , MD CSN: [id number] Age: 53 Admit Type: Outpatient Procedure:                Colonoscopy Indications:              Screening for colorectal malignant neoplasm Providers:                Norvel Richards, MD, Otis Peak B. Sharon Seller, RN,                            Nelma Rothman, Technician Referring MD:             Manon Hilding MD, MD Medicines:                Midazolam 6 mg IV, Meperidine 40 mg IV Complications:            No immediate complications. Estimated Blood Loss:     Estimated blood loss was minimal. Procedure:                Pre-Anesthesia Assessment:                           - Prior to the procedure, a History and Physical                            was performed, and patient medications and                            allergies were reviewed. The patient's tolerance of                            previous anesthesia was also reviewed. The risks                            and benefits of the procedure and the sedation                            options and risks were discussed with the patient.                            All questions were answered, and informed consent                            was obtained. Prior Anticoagulants: The patient has                            taken no previous anticoagulant or antiplatelet                            agents. [ASA Grade]. After reviewing the risks and                            benefits, the patient was deemed in satisfactory  condition to undergo the procedure.                           After obtaining informed consent, the colonoscope                            was passed under direct vision. Throughout the                            procedure, the patient's blood pressure, pulse, and                            oxygen saturations were monitored  continuously. The                            [id number]-[id number] ([id number]) scope was introduced through                            the anus and advanced to the the cecum, identified                            by appendiceal orifice and ileocecal valve. Scope In: 1:29:33 PM Scope Out: 1:44:43 PM Scope Withdrawal Time: 0 hours 9 minutes 33 seconds  Total Procedure Duration: 0 hours 15 minutes 10 seconds  Findings:      The perianal and digital rectal examinations were normal.      A 5 mm polyp was found in the rectum. The polyp was sessile. The polyp       was removed with a cold snare. Resection and retrieval were complete.       Estimated blood loss was minimal.      Scattered small and large-mouthed diverticula were found in the sigmoid       colon and descending colon.      The exam was otherwise without abnormality on direct and retroflexion       views. Impression:               - One 5 mm polyp in the rectum, removed with a cold                            snare. Resected and retrieved.                           - Diverticulosis in the sigmoid colon and in the                            descending colon.                           - The examination was otherwise normal on direct                            and retroflexion views. Moderate Sedation:      Moderate (conscious) sedation was administered by the endoscopy nurse       and supervised by the endoscopist. The following parameters were  monitored: oxygen saturation, heart rate, blood pressure, respiratory       rate, EKG, adequacy of pulmonary ventilation, and response to care.       Total physician intraservice time was 20 minutes. Recommendation:           - Patient has a contact number available for                            emergencies. The signs and symptoms of potential                            delayed complications were discussed with the                            patient. Return to normal activities tomorrow.                             Written discharge instructions were provided to the                            patient.                           - Resume previous diet.                           - Continue present medications.                           - Repeat colonoscopy date to be determined after                            pending pathology results are reviewed for                            surveillance.                           - Return to GI office (date not yet determined). Procedure Code(s):        --- Professional ---                           [phone], Colonoscopy, flexible; with removal of                            tumor(s), polyp(s), or other lesion(s) by snare                            technique                           G0500, Moderate sedation services provided by the                            same physician or other qualified health care  professional performing a gastrointestinal                            endoscopic service that sedation supports,                            requiring the presence of an independent trained                            observer to assist in the monitoring of the                            patient's level of consciousness and physiological                            status; initial 15 minutes of intra-service time;                            patient age 53 years or older (additional time may                            be reported with [phone], as appropriate) Diagnosis Code(s):        --- Professional ---                           Z12.11, Encounter for screening for malignant                            neoplasm of colon                           K62.1, Rectal polyp                           K57.30, Diverticulosis of large intestine without                            perforation or abscess without bleeding CPT copyright 2018 American Medical Association. All rights reserved. The codes documented in this report are preliminary and  upon coder review may  be revised to meet current compliance requirements. Cristopher Estimable. Ashtynn Berke, MD Norvel Richards, MD 12/01/2018 1:54:22 PM This report has been signed electronically. Number of Addenda: 0

## 2018-12-01 NOTE — H&P (Signed)
@LOGO @   Primary Care Physician:  Curlene Labrum, MD Primary Gastroenterologist:  Dr. Gala Romney  Pre-Procedure History & Physical: HPI:  Carmen Kelley is a 53 y.o. female is here for a screening colonoscopy.  No prior colonoscopy.  No GI symptoms.  No family history of colon cancer.  Past Medical History:  Diagnosis Date  . Anxiety   . Constipation   . GERD (gastroesophageal reflux disease)     Past Surgical History:  Procedure Laterality Date  . APPENDECTOMY  1997  . OVARY SURGERY Right 1997   part of ovary removed    Prior to Admission medications   Medication Sig Start Date End Date Taking? Authorizing Provider  DUAVEE 0.45-20 MG TABS TAKE ONE TABLET BY MOUTH DAILY Patient taking differently: Take 1 tablet by mouth daily.  09/20/18  Yes Estill Dooms, NP  Multiple Vitamin (MULTIVITAMIN WITH MINERALS) TABS tablet Take 1 tablet by mouth 2 (two) times a week.   Yes [provider]  Na Sulfate-K Sulfate-Mg Sulf (SUPREP BOWEL PREP KIT) 17.5-3.13-1.6 GM/177ML SOLN Take 1 kit by mouth as directed. 08/24/18  Yes Annitta Needs, NP  nicotine (NICODERM CQ - DOSED IN MG/24 HOURS) 14 mg/24hr patch Place 14 mg onto the skin daily.   Yes [provider]  omeprazole (PRILOSEC) 20 MG capsule TAKE ONE CAPSULE (20MG TOTAL) BY MOUTH DAILY Patient taking differently: Take 20 mg by mouth daily.  09/14/18  Yes Derrek Monaco A, NP  trolamine salicylate (ASPERCREME) 10 % cream Apply 1 application topically as needed for muscle pain.   Yes [provider]  pantoprazole (PROTONIX) 20 MG tablet Take 1 tablet (20 mg total) by mouth daily. Patient not taking: Reported on 11/30/2018 03/24/18   Roma Schanz, CNM  polyethylene glycol [facility] / GLYCOLAX) packet Take 17 g by mouth daily. Patient taking differently: Take 17 g by mouth as needed.  07/02/16   Estill Dooms, NP    Allergies as of 08/24/2018  . (No Known Allergies)    Family History  Problem  Relation Age of Onset  . Cancer Father        pancreatic  . Alcoholism Father   . Hypertension Mother     Social History   Socioeconomic History  . Marital status: Single    Spouse name: Not on file  . Number of children: Not on file  . Years of education: Not on file  . Highest education level: Not on file  Occupational History  . Not on file  Social Needs  . Financial resource strain: Not on file  . Food insecurity:    Worry: Not on file    Inability: Not on file  . Transportation needs:    Medical: Not on file    Non-medical: Not on file  Tobacco Use  . Smoking status: Current Every Day Smoker    Packs/day: 1.00    Years: 33.00    Pack years: 33.00    Types: Cigarettes  . Smokeless tobacco: Never Used  Substance and Sexual Activity  . Alcohol use: Yes    Comment: 2 times weekly  . Drug use: No  . Sexual activity: Not Currently  Lifestyle  . Physical activity:    Days per week: Not on file    Minutes per session: Not on file  . Stress: Not on file  Relationships  . Social connections:    Talks on phone: Not on file    Gets together: Not on file  Attends religious service: Not on file    Active member of club or organization: Not on file    Attends meetings of clubs or organizations: Not on file    Relationship status: Not on file  . Intimate partner violence:    Fear of current or ex partner: Not on file    Emotionally abused: Not on file    Physically abused: Not on file    Forced sexual activity: Not on file  Other Topics Concern  . Not on file  Social History Narrative  . Not on file    Review of Systems: See HPI, otherwise negative ROS  Physical Exam: BP 104/77   Pulse 83   Temp 98.9 F (37.2 C)   Resp 20   Ht 5' 4"  (1.626 m)   Wt 57.2 kg   SpO2 97%   BMI 21.63 kg/m  General:   Alert,  Well-developed, well-nourished, pleasant and cooperative in NAD Heart:  Regular rate and rhythm; no murmurs, clicks, rubs,  or gallops. Abdomen:   Soft, nontender and nondistended. No masses, hepatosplenomegaly or hernias noted. Normal bowel sounds, without guarding, and without rebound.    Impression/Plan: Carmen Kelley is now here to undergo a screening colonoscopy.  First ever average rescreening examination.  Risks, benefits, limitations, imponderables and alternatives regarding colonoscopy have been reviewed with the patient. Questions have been answered. All parties agreeable.     Notice:  This dictation was prepared with Dragon dictation along with smaller phrase technology. Any transcriptional errors that result from this process are unintentional and may not be corrected upon review.

## 2018-12-01 NOTE — Discharge Instructions (Signed)
Diverticulosis  Diverticulosis is a condition that develops when small pouches (diverticula) form in the wall of the large intestine (colon). The colon is where water is absorbed and stool is formed. The pouches form when the inside layer of the colon pushes through weak spots in the outer layers of the colon. You may have a few pouches or many of them. What are the causes? The cause of this condition is not known. What increases the risk? The following factors may make you more likely to develop this condition:  Being older than age 85. Your risk for this condition increases with age. Diverticulosis is rare among people younger than age 3. By age 69, many people have it.  Eating a low-fiber diet.  Having frequent constipation.  Being overweight.  Not getting enough exercise.  Smoking.  Taking over-the-counter pain medicines, like aspirin and ibuprofen.  Having a family history of diverticulosis. What are the signs or symptoms? In most people, there are no symptoms of this condition. If you do have symptoms, they may include:  Bloating.  Cramps in the abdomen.  Constipation or diarrhea.  Pain in the lower left side of the abdomen. How is this diagnosed? This condition is most often diagnosed during an exam for other colon problems. Because diverticulosis usually has no symptoms, it often cannot be diagnosed independently. This condition may be diagnosed by:  Using a flexible scope to examine the colon (colonoscopy).  Taking an X-ray of the colon after dye has been put into the colon (barium enema).  Doing a CT scan. How is this treated? You may not need treatment for this condition if you have never developed an infection related to diverticulosis. If you have had an infection before, treatment may include:  Eating a high-fiber diet. This may include eating more fruits, vegetables, and grains.  Taking a fiber supplement.  Taking a live bacteria supplement  (probiotic).  Taking medicine to relax your colon.  Taking antibiotic medicines. Follow these instructions at home:  Drink 6-8 glasses of water or more each day to prevent constipation.  Try not to strain when you have a bowel movement.  If you have had an infection before: ? Eat more fiber as directed by your health care provider or your diet and nutrition specialist (dietitian). ? Take a fiber supplement or probiotic, if your health care provider approves.  Take over-the-counter and prescription medicines only as told by your health care provider.  If you were prescribed an antibiotic, take it as told by your health care provider. Do not stop taking the antibiotic even if you start to feel better.  Keep all follow-up visits as told by your health care provider. This is important. Contact a health care provider if:  You have pain in your abdomen.  You have bloating.  You have cramps.  You have not had a bowel movement in 3 days. Get help right away if:  Your pain gets worse.  Your bloating becomes very bad.  You have a fever or chills, and your symptoms suddenly get worse.  You vomit.  You have bowel movements that are bloody or black.  You have bleeding from your rectum. Summary  Diverticulosis is a condition that develops when small pouches (diverticula) form in the wall of the large intestine (colon).  You may have a few pouches or many of them.  This condition is most often diagnosed during an exam for other colon problems.  If you have had an infection related to  diverticulosis, treatment may include increasing the fiber in your diet, taking supplements, or taking medicines. This information is not intended to replace advice given to you by your health care provider. Make sure you discuss any questions you have with your health care provider. Document Released: 07/08/2004 Document Revised: 08/30/2016 Document Reviewed: 08/30/2016 Elsevier Interactive Patient  Education  2019 [place]. Colon Polyps  Polyps are tissue growths inside the body. Polyps can grow in many places, including the large intestine (colon). A polyp may be a round bump or a mushroom-shaped growth. You could have one polyp or several. Most colon polyps are noncancerous (benign). However, some colon polyps can become cancerous over time. Finding and removing the polyps early can help prevent this. What are the causes? The exact cause of colon polyps is not known. What increases the risk? You are more likely to develop this condition if you:  Have a family history of colon cancer or colon polyps.  Are older than 55 or older than 45 if you are African American.  Have inflammatory bowel disease, such as ulcerative colitis or Crohn's disease.  Have certain hereditary conditions, such as: ? Familial adenomatous polyposis. ? Lynch syndrome. ? Turcot syndrome. ? Peutz-Jeghers syndrome.  Are overweight.  Smoke cigarettes.  Do not get enough exercise.  Drink too much alcohol.  Eat a diet that is high in fat and red meat and low in fiber.  Had childhood cancer that was treated with abdominal radiation. What are the signs or symptoms? Most polyps do not cause symptoms. If you have symptoms, they may include:  Blood coming from your rectum when having a bowel movement.  Blood in your stool. The stool may look dark red or black.  Abdominal pain.  A change in bowel habits, such as constipation or diarrhea. How is this diagnosed? This condition is diagnosed with a colonoscopy. This is a procedure in which a lighted, flexible scope is inserted into the anus and then passed into the colon to examine the area. Polyps are sometimes found when a colonoscopy is done as part of routine cancer screening tests. How is this treated? Treatment for this condition involves removing any polyps that are found. Most polyps can be removed during a colonoscopy. Those polyps will then  be tested for cancer. Additional treatment may be needed depending on the results of testing. Follow these instructions at home: Lifestyle  Maintain a healthy weight, or lose weight if recommended by your health care provider.  Exercise every day or as told by your health care provider.  Do not use any products that contain nicotine or tobacco, such as cigarettes and e-cigarettes. If you need help quitting, ask your health care provider.  If you drink alcohol, limit how much you have: ? 0-1 drink a day for women. ? 0-2 drinks a day for men.  Be aware of how much alcohol is in your drink. In the [place] one drink equals one 12 oz bottle of beer (355 mL), one 5 oz glass of wine (148 mL), or one 1 oz shot of hard liquor (44 mL). Eating and drinking   Eat foods that are high in fiber, such as fruits, vegetables, and whole grains.  Eat foods that are high in calcium and vitamin D, such as milk, cheese, yogurt, eggs, liver, fish, and broccoli.  Limit foods that are high in fat, such as fried foods and desserts.  Limit the amount of red meat and processed meat you eat, such as hot  dogs, sausage, bacon, and lunch meats. General instructions  Keep all follow-up visits as told by your health care provider. This is important. ? This includes having regularly scheduled colonoscopies. ? Talk to your health care provider about when you need a colonoscopy. Contact a health care provider if:  You have new or worsening bleeding during a bowel movement.  You have new or increased blood in your stool.  You have a change in bowel habits.  You lose weight for no known reason. Summary  Polyps are tissue growths inside the body. Polyps can grow in many places, including the colon.  Most colon polyps are noncancerous (benign), but some can become cancerous over time.  This condition is diagnosed with a colonoscopy.  Treatment for this condition involves removing any polyps that are found. Most  polyps can be removed during a colonoscopy. This information is not intended to replace advice given to you by your health care provider. Make sure you discuss any questions you have with your health care provider. Document Released: 07/07/2004 Document Revised: 01/26/2018 Document Reviewed: 01/26/2018 Elsevier Interactive Patient Education  2019 [place].  Colonoscopy Discharge Instructions  Read the instructions outlined below and refer to this sheet in the next few weeks. These discharge instructions provide you with general information on caring for yourself after you leave the hospital. Your doctor may also give you specific instructions. While your treatment has been planned according to the most current medical practices available, unavoidable complications occasionally occur. If you have any problems or questions after discharge, call Dr. Gala Romney at [phone]. ACTIVITY  You may resume your regular activity, but move at a slower pace for the next 24 hours.   Take frequent rest periods for the next 24 hours.   Walking will help get rid of the air and reduce the bloated feeling in your belly (abdomen).   No driving for 24 hours (because of the medicine (anesthesia) used during the test).    Do not sign any important legal documents or operate any machinery for 24 hours (because of the anesthesia used during the test).  NUTRITION  Drink plenty of fluids.   You may resume your normal diet as instructed by your doctor.   Begin with a light meal and progress to your normal diet. Heavy or fried foods are harder to digest and may make you feel sick to your stomach (nauseated).   Avoid alcoholic beverages for 24 hours or as instructed.  MEDICATIONS  You may resume your normal medications unless your doctor tells you otherwise.  WHAT YOU CAN EXPECT TODAY  Some feelings of bloating in the abdomen.   Passage of more gas than usual.   Spotting of blood in your stool or on the toilet  paper.  IF YOU HAD POLYPS REMOVED DURING THE COLONOSCOPY:  No aspirin products for 7 days or as instructed.   No alcohol for 7 days or as instructed.   Eat a soft diet for the next 24 hours.  FINDING OUT THE RESULTS OF YOUR TEST Not all test results are available during your visit. If your test results are not back during the visit, make an appointment with your caregiver to find out the results. Do not assume everything is normal if you have not heard from your caregiver or the medical facility. It is important for you to follow up on all of your test results.  SEEK IMMEDIATE MEDICAL ATTENTION IF:  You have more than a spotting of blood in your  stool.   Your belly is swollen (abdominal distention).   You are nauseated or vomiting.   You have a temperature over 101.   You have abdominal pain or discomfort that is severe or gets worse throughout the day.    Colon polyp and diverticulosis information provided  Further recommendations to follow pending review of pathology report

## 2018-12-05 ENCOUNTER — Encounter: Payer: Self-pay | Admitting: Internal Medicine

## 2018-12-07 ENCOUNTER — Encounter (HOSPITAL_COMMUNITY): Payer: Self-pay | Admitting: Internal Medicine

## 2019-03-13 ENCOUNTER — Telehealth: Payer: Self-pay | Admitting: Adult Health

## 2019-03-13 NOTE — Telephone Encounter (Signed)
Patient called, she would like to talk to [facility] regarding her hormone medications.  She was very rude.  She wanted help with mychart as well, tried to give her the number for the help desk for mychart help, she cut me off and said nope, nope, nope.  She didn't want to give me her phone number because it should be in her chart.  I explained that I could not see it from the screen I was in, that's why I was asking for it.  It was different than the number she was calling from.  Made her aware Anderson Malta was not in the office today, but would be here tomorrow.  She wants to speak directly to [facility].  [phone]

## 2019-03-14 MED ORDER — PAROXETINE HCL 10 MG PO TABS: 10.0000 mg | ORAL_TABLET | Freq: Every day | ORAL | 2 refills | Status: DC

## 2019-03-14 NOTE — Addendum Note (Signed)
Addended by: Derrek Monaco A on: 03/14/2019 01:43 PM   Modules accepted: Orders

## 2019-03-14 NOTE — Telephone Encounter (Signed)
Having night sweats and has felt some nausea,head felt funny and "nerves" not good esp at work , will continue [facility] and will rx Paxil, and make appt to be seen tomorrow at 1:30

## 2019-03-14 NOTE — Telephone Encounter (Signed)
Left message I returned her call  

## 2019-03-15 ENCOUNTER — Ambulatory Visit: Payer: 59 | Admitting: Adult Health

## 2019-03-15 ENCOUNTER — Other Ambulatory Visit: Payer: Self-pay

## 2019-03-15 ENCOUNTER — Encounter: Payer: Self-pay | Admitting: Adult Health

## 2019-03-15 VITALS — BP 115/76 | HR 70 | Temp 98.3°F | Ht 65.0 in | Wt 134.0 lb

## 2019-03-15 DIAGNOSIS — F419 Anxiety disorder, unspecified: Secondary | ICD-10-CM

## 2019-03-15 DIAGNOSIS — R4589 Other symptoms and signs involving emotional state: Secondary | ICD-10-CM

## 2019-03-15 DIAGNOSIS — Z79890 Hormone replacement therapy (postmenopausal): Secondary | ICD-10-CM

## 2019-03-15 DIAGNOSIS — N951 Menopausal and female climacteric states: Secondary | ICD-10-CM | POA: Diagnosis not present

## 2019-03-15 NOTE — Progress Notes (Signed)
Patient ID: Carmen Kelley, female   DOB: [birth year], 53 y.o.   MRN: [id number] History of Present Illness: Carmen Kelley is a 53 year old white female, divorced, in complaining of being more anxious and stressed, and having more night sweats, is on Duvaee. She had physical with PCP, and has had mammogram and colonoscopy. She especially feels stressed at work and has old dog, that is going to die soon and has missed going to church and feels she works all the time.  PCP is Dr Pleas Koch   Current Medications, Allergies, Past Medical History, Past Surgical History, Family History and Social History were reviewed in [facility] record.     Review of Systems: Feels anxious and stressed More night sweats  Had dizzy episode about a week ago    Physical Exam:BP 115/76 (BP Location: Left Arm, Patient Position: Sitting, Cuff Size: Normal)   Pulse 70   Temp 98.3 F (36.8 C)   Ht 5\' 5"  (1.651 m)   Wt 134 lb (60.8 kg)   BMI 22.30 kg/m  General:  Well developed, well nourished, no acute distress Skin:  Warm and dry Lungs; Clear to auscultation bilaterally Cardiovascular: Regular rate and rhythm Psych:  No mood changes, alert and cooperative,seems stressed. Fall risk is low. PHQ 9 score is 3, to begin Paxil, denies being suicidal or homicidal  Need to remember you ar important and worthy. Face time 15 minutes, with 50 % counseling   Impression and Plan: 1. Anxiety -take Paxil 10 mg daily, has Rx -Follow up in 6 weeks, may add vistaril if needed  Note given for missing work today, return 5/26  2. Feeling anxious -try praying and medication  3. Peri-menopausal -continue Duavee, has Rx  4. Hormone replacement therapy (HRT)

## 2019-03-27 ENCOUNTER — Telehealth: Payer: Self-pay | Admitting: Adult Health

## 2019-03-27 NOTE — Telephone Encounter (Signed)
Patient called, she is wanting samples of Duavee, the pharmacy told her that they can't get it unitl July.  [place]  [phone]

## 2019-04-18 ENCOUNTER — Telehealth: Payer: Self-pay | Admitting: Adult Health

## 2019-04-18 MED ORDER — TRAZODONE HCL 50 MG PO TABS: 50.0000 mg | ORAL_TABLET | Freq: Every day | ORAL | 0 refills | Status: DC

## 2019-04-18 MED ORDER — ESTRADIOL 1 MG PO TABS: 1.0000 mg | ORAL_TABLET | Freq: Every day | ORAL | 6 refills | Status: DC

## 2019-04-18 MED ORDER — PROGESTERONE MICRONIZED 200 MG PO CAPS: ORAL_CAPSULE | ORAL | 6 refills | Status: DC

## 2019-04-18 NOTE — Telephone Encounter (Signed)
Patient called, stated she hasn't been able to get her meds, I could not make out what she said, asked multiple times.  She is requesting to speak directly to [facility].  [phone]  [phone] - call this # if calling before 5pm, this is her work #, you'll need to ask for her

## 2019-04-18 NOTE — Telephone Encounter (Signed)
Mom has had DVT and PE, she is stressed, did not start Paxil (was afraid to take),and is teary and not sleeping,she is off duavee, can't get, will rx estrace and Prometrium and will rx trazodone 50 mg at HS

## 2019-05-04 ENCOUNTER — Ambulatory Visit: Payer: 59 | Admitting: Adult Health

## 2019-06-11 ENCOUNTER — Other Ambulatory Visit: Payer: Self-pay | Admitting: Women's Health

## 2019-06-11 ENCOUNTER — Telehealth: Payer: Self-pay | Admitting: Women's Health

## 2019-06-11 DIAGNOSIS — N950 Postmenopausal bleeding: Secondary | ICD-10-CM

## 2019-06-11 NOTE — Telephone Encounter (Signed)
LM to return call.  Roma Schanz, CNM, WHNP-BC 06/11/2019 1:30 PM

## 2019-06-19 ENCOUNTER — Telehealth: Payer: Self-pay | Admitting: Women's Health

## 2019-06-19 NOTE — Telephone Encounter (Signed)
I called the patient back, asked her to keep her appointment with Dr. Glo Herring for tomorrow so that she may discuss all her questions with him in person.  She stated no, that she wasn't coming in, she has to work.  She thanked me for calling and stated that she'll call us back when she can get in.  I'm cancelling both of her appointments.

## 2019-06-19 NOTE — Telephone Encounter (Signed)
Patient called, stated she has a huge amount of questions about what you and her have been sending on mychart.  She stated that she needs to call back when she can speak with you.  Patient is at work, stated she doesn't know what time she will be getting off, she stated it depends.  She doesn't want to move forward with anything until she speaks with you.  [phone] or [phone]

## 2019-06-20 ENCOUNTER — Other Ambulatory Visit: Payer: 59

## 2019-06-20 ENCOUNTER — Ambulatory Visit: Payer: 59 | Admitting: Obstetrics and Gynecology

## 2019-06-26 ENCOUNTER — Telehealth: Payer: Self-pay | Admitting: Obstetrics and Gynecology

## 2019-06-26 NOTE — Telephone Encounter (Signed)
Pt cancelled appt on 8/26 and I have her r/s for 9/15. Pt is wanting to discuss other issues she has going on and to see if she can come in for an appt sooner.

## 2019-06-27 NOTE — Telephone Encounter (Signed)
Pt is having anxiety and is wanting to been seen sooner than 9/15. Pt states she is having issues with her job. Pt is scheduled for an [place] on 9/15 also. Daisy to speak with pt to see if she wanted to come in sooner to discuss anxiety but keep [place] appt for 9/15. [place]

## 2019-07-06 ENCOUNTER — Encounter: Payer: Self-pay | Admitting: Obstetrics and Gynecology

## 2019-07-06 ENCOUNTER — Other Ambulatory Visit: Payer: Self-pay

## 2019-07-06 ENCOUNTER — Ambulatory Visit: Payer: No Typology Code available for payment source | Admitting: Obstetrics and Gynecology

## 2019-07-06 VITALS — BP 101/70 | HR 82 | Ht 64.0 in | Wt 128.4 lb

## 2019-07-06 DIAGNOSIS — N760 Acute vaginitis: Secondary | ICD-10-CM | POA: Diagnosis not present

## 2019-07-06 DIAGNOSIS — B9689 Other specified bacterial agents as the cause of diseases classified elsewhere: Secondary | ICD-10-CM

## 2019-07-06 MED ORDER — METRONIDAZOLE 500 MG PO TABS: 500.0000 mg | ORAL_TABLET | Freq: Two times a day (BID) | ORAL | 1 refills | Status: DC

## 2019-07-06 NOTE — Progress Notes (Addendum)
Patient ID: [name], female   DOB: 03/19/1966, 53 y.o.   MRN: [id number]    Carmen Kelley: Carmen Kelley MRN [id number]  Date of birth: [birth year]  CC & HPI: Situational stress, menopause management  Carmen Kelley is a 53 y.o. female presenting today for discussion of spotting. Seen Jeniffer NP 03/15/2019 was prescribed paxil.  Is perimenopausal, was on Duavee 2 months, pharmacy ran out and couldn't get any until the end of August. Talked to [place] and she called in estrace and prometrium to take one in the morning and one at night. Was scheduled for Aug 15 for TV u/s but spotting has since stopped. After taking medicine around 2 1/2 weeks later had some spotting, hadn't had a period in 2-3 years.  Had night sweats on Duavee but since taking estrace and prometrium hasn't had any night sweats.  Is anxious about being fired from job due to [employer], is also concerned about friendship that exist through and at work. Has been making mistakes at work and boss has been on her case more often and is getting emotional while at work. Has recently pet died after 82 years and is extremely sad. Is no longer taking paxil and is scared to take it.  ROS:  ROS   Pertinent History Reviewed:   Reviewed: Significant for Medical         Past Medical History:  Diagnosis Date  . Anxiety   . Constipation   . GERD (gastroesophageal reflux disease)                               Surgical Hx:    Past Surgical History:  Procedure Laterality Date  . APPENDECTOMY  1997  . COLONOSCOPY N/A 12/01/2018   Procedure: COLONOSCOPY;  Surgeon: Daneil Dolin, MD;  [facility];  Service: Endoscopy;  Laterality: N/A;  3:00-rescheduled to 2/7 @ 1:00pm per office  . OVARY SURGERY Right 1997   part of ovary removed  . POLYPECTOMY  12/01/2018   Procedure: POLYPECTOMY;  Surgeon: Daneil Dolin, MD;  [facility]: [facility];  Service: Endoscopy;;  colon    Medications: Reviewed & Updated - see associated section                       Current Outpatient Medications:  Marland Kitchen  DUAVEE 0.45-20 MG TABS, TAKE ONE TABLET BY MOUTH DAILY (Patient taking differently: Take 1 tablet by mouth daily. ), Disp: 30 tablet, Rfl: 12 .  estradiol (ESTRACE) 1 MG tablet, Take 1 tablet (1 mg total) by mouth daily., Disp: 30 tablet, Rfl: 6 .  Multiple Vitamin (MULTIVITAMIN WITH MINERALS) TABS tablet, Take 1 tablet by mouth 2 (two) times a week., Disp: , Rfl:  .  Na Sulfate-K Sulfate-Mg Sulf (SUPREP BOWEL PREP KIT) 17.5-3.13-1.6 GM/177ML SOLN, Take 1 kit by mouth as directed. (Patient not taking: Reported on 03/15/2019), Disp: 1 Bottle, Rfl: 0 .  nicotine (NICODERM CQ - DOSED IN MG/24 HOURS) 14 mg/24hr patch, Place 14 mg onto the skin daily., Disp: , Rfl:  .  omeprazole (PRILOSEC) 20 MG capsule, TAKE ONE CAPSULE (20MG TOTAL) BY MOUTH DAILY (Patient taking differently: Take 20 mg by mouth daily. ), Disp: 30 capsule, Rfl: 12 .  progesterone (PROMETRIUM) 200 MG capsule, Take 1 as HS, Disp: 30  capsule, Rfl: 6 .  traZODone (DESYREL) 50 MG tablet, Take 1 tablet (50 mg total) by mouth at bedtime., Disp: 30 tablet, Rfl: 0 .  trolamine salicylate (ASPERCREME) 10 % cream, Apply 1 application topically as needed for muscle pain., Disp: , Rfl:    Social History: Reviewed -  reports that she has been smoking cigarettes. She has a 33.00 pack-year smoking history. She has never used smokeless tobacco.  Objective Findings:  Vitals: There were no vitals taken for this visit.  PHYSICAL EXAMINATION General appearance - alert, well appearing, and in no distress and anxious Mental status - alert, oriented to person, place, and time, normal mood, behavior, speech, dress, motor activity, and thought processes, affect appropriate to mood  PELVIC Deferred  Assessment & Plan:   A:  1. Perimenopausal 2. Anxiety, 3. situational stress  P:  1.  TV u/s 2. F/u with support group of female  friends 3. Begin rx paxil 4. Resume HRT after TV u/s   By signing my Kelley below, I, Samul Dada, attest that this documentation has been prepared under the direction and in the presence of Jonnie Kind, MD. Electronically Signed: [place]. 07/06/19. 12:30 PM.  /I personally performed the services described in this documentation, which was SCRIBED in my presence. The recorded information has been reviewed and considered accurate. It has been edited as necessary during review. Jonnie Kind, MD

## 2019-07-10 ENCOUNTER — Ambulatory Visit (INDEPENDENT_AMBULATORY_CARE_PROVIDER_SITE_OTHER): Payer: No Typology Code available for payment source

## 2019-07-10 ENCOUNTER — Encounter: Payer: Self-pay | Admitting: Obstetrics and Gynecology

## 2019-07-10 ENCOUNTER — Ambulatory Visit (INDEPENDENT_AMBULATORY_CARE_PROVIDER_SITE_OTHER): Payer: No Typology Code available for payment source | Admitting: Obstetrics and Gynecology

## 2019-07-10 ENCOUNTER — Other Ambulatory Visit: Payer: Self-pay

## 2019-07-10 ENCOUNTER — Ambulatory Visit: Payer: 59 | Admitting: Obstetrics and Gynecology

## 2019-07-10 VITALS — BP 111/67 | HR 77 | Ht 64.0 in | Wt 129.0 lb

## 2019-07-10 DIAGNOSIS — R4589 Other symptoms and signs involving emotional state: Secondary | ICD-10-CM

## 2019-07-10 DIAGNOSIS — F419 Anxiety disorder, unspecified: Secondary | ICD-10-CM

## 2019-07-10 DIAGNOSIS — N950 Postmenopausal bleeding: Secondary | ICD-10-CM | POA: Diagnosis not present

## 2019-07-10 NOTE — Progress Notes (Signed)
Patient ID: Carmen Kelley, female   DOB: 04-03-1966, 53 y.o.   MRN: [id number]    [facility] Visit  @DATE @            Patient name: Carmen Kelley MRN [id number]  Date of birth: March 27, 1966  CC & HPI:  Carmen Kelley is a 53 y.o. female presenting today for f/u of TV u/s.  PELVIC US TA/TV: Heterogenous anteverted uterus with two fibroids (#1) pedunculated posterior fundal 2.2 x 1.5 x 1.8 cm,(#2) subserosal mid right 1.2 x 1.7 x 1.7 cm,EEC 2 mm,normal ovaries bilat,limited view of right ovary,left ovary appears mobile,no free fluid,no pain during [employer]  Started taking paxil and felt a bit dizzy but is doing otherwise fine. Is not as stressed about losing job as she was before. Talked to friends whom she was having a squabble with at work and they talked some things out.   ROS:  ROS   Pertinent History Reviewed:   Reviewed:  Medical         Past Medical History:  Diagnosis Date  . Anxiety   . Constipation   . GERD (gastroesophageal reflux disease)                               Surgical Hx:    Past Surgical History:  Procedure Laterality Date  . APPENDECTOMY  1997  . COLONOSCOPY N/A 12/01/2018   Procedure: COLONOSCOPY;  Surgeon: Daneil Dolin, MD;  [facility];  Service: Endoscopy;  Laterality: N/A;  3:00-rescheduled to 2/7 @ 1:00pm per office  . OVARY SURGERY Right 1997   part of ovary removed  . POLYPECTOMY  12/01/2018   Procedure: POLYPECTOMY;  Surgeon: Daneil Dolin, MD;  [facility];  Service: Endoscopy;;  colon   Medications: Reviewed & Updated - see associated section                       Current Outpatient Medications:  Marland Kitchen  Multiple Vitamin (MULTIVITAMIN WITH MINERALS) TABS tablet, Take 1 tablet by mouth 2 (two) times a week., Disp: , Rfl:  .  omeprazole (PRILOSEC) 20 MG capsule, TAKE ONE CAPSULE (20MG  TOTAL) BY MOUTH DAILY (Patient taking differently: Take 20 mg by mouth daily. ), Disp: 30 capsule, Rfl: 12 .   trolamine salicylate (ASPERCREME) 10 % cream, Apply 1 application topically as needed for muscle pain., Disp: , Rfl:    Social History: Reviewed -  reports that she has been smoking cigarettes. She has a 33.00 pack-year smoking history. She has never used smokeless tobacco.  Objective Findings:  Vitals: Blood pressure 111/67, pulse 77, height 5\' 4"  (1.626 m), weight 129 lb (58.5 kg).  PHYSICAL EXAMINATION General appearance - alert, well appearing, and in no distress and improved Mental status - alert, oriented to person, place, and time, normal mood, behavior, speech, dress, motor activity, and thought processes   PELVIC Discussion only  Assessment & Plan:   A:  1. Single episode of bleeding 2. Anxiety, situational stress   P:  1.  Continue paxil 2 f.u prn   By signing my name below, I, Samul Dada, attest that this documentation has been prepared under the direction and in the presence of Jonnie Kind, MD. Electronically Signed: [place]. 07/10/19. 9:52 AM.  I personally performed the services described in this documentation, which was SCRIBED in my presence. The  recorded information has been reviewed and considered accurate. It has been edited as necessary during review. Jonnie Kind, MD

## 2019-07-10 NOTE — Progress Notes (Signed)
PELVIC US TA/TV: Heterogenous anteverted uterus with two fibroids (#1) pedunculated posterior fundal 2.2 x 1.5 x 1.8 cm,(#2) subserosal mid right 1.2 x 1.7 x 1.7 cm,EEC 2 mm,normal ovaries bilat,limited view of right ovary,left ovary appears mobile,no free fluid,no pain during [employer]

## 2019-08-10 ENCOUNTER — Ambulatory Visit: Payer: No Typology Code available for payment source | Admitting: Adult Health

## 2019-09-24 ENCOUNTER — Other Ambulatory Visit: Payer: Self-pay | Admitting: Adult Health

## 2019-10-24 ENCOUNTER — Other Ambulatory Visit: Payer: Self-pay | Admitting: Adult Health

## 2019-10-24 MED ORDER — PAROXETINE HCL 10 MG PO TABS: 10.0000 mg | ORAL_TABLET | Freq: Every day | ORAL | 2 refills | Status: DC

## 2019-10-24 NOTE — Progress Notes (Signed)
Will rx paxil

## 2020-10-08 ENCOUNTER — Other Ambulatory Visit: Payer: Self-pay | Admitting: Adult Health

## 2021-08-11 ENCOUNTER — Other Ambulatory Visit: Payer: Self-pay | Admitting: Family Medicine

## 2021-08-11 ENCOUNTER — Other Ambulatory Visit (HOSPITAL_COMMUNITY): Payer: Self-pay | Admitting: Family Medicine

## 2021-08-11 DIAGNOSIS — R1013 Epigastric pain: Secondary | ICD-10-CM

## 2021-08-20 ENCOUNTER — Ambulatory Visit (HOSPITAL_COMMUNITY)
Admission: RE | Admit: 2021-08-20 | Discharge: 2021-08-20 | Disposition: A | Discharge status: Home / Self Care | Payer: Self-pay | Source: Ambulatory Visit | Attending: Family Medicine | Admitting: Family Medicine

## 2021-08-20 ENCOUNTER — Other Ambulatory Visit: Payer: Self-pay

## 2021-08-20 DIAGNOSIS — R1013 Epigastric pain: Secondary | ICD-10-CM | POA: Insufficient documentation

## 2021-09-07 ENCOUNTER — Encounter: Payer: Self-pay | Admitting: Internal Medicine

## 2021-10-22 ENCOUNTER — Encounter: Payer: Self-pay | Admitting: Gastroenterology

## 2021-10-22 NOTE — Progress Notes (Deleted)
Referring Provider: Curlene Labrum, MD Primary Care Physician:  Curlene Labrum, MD Primary GI Physician: Dr. Gala Romney  No chief complaint on file.   HPI:   Carmen Kelley is a 55 y.o. female presenting today with a history of adenomatous colon polyp, due for surveillance colonoscopy in 2027, presenting today with chief complaint of epigastric abdominal pain.  Abdominal ultrasound on file in October 2022 for epigastric abdominal pain was unremarkable.      Past Medical History:  Diagnosis Date   Anxiety    Constipation    GERD (gastroesophageal reflux disease)     Past Surgical History:  Procedure Laterality Date   APPENDECTOMY  1997   COLONOSCOPY N/A 12/01/2018   Surgeon: Daneil Dolin, MD; one 5 mm tubular adenoma, diverticulosis in sigmoid and descending colon.  Repeat in 7 years.   OVARY SURGERY Right 1997   part of ovary removed   POLYPECTOMY  12/01/2018   Procedure: POLYPECTOMY;  Surgeon: Daneil Dolin, MD;  Location: [facility];  Service: Endoscopy;;  colon    Current Outpatient Medications  Medication Sig Dispense Refill   Multiple Vitamin (MULTIVITAMIN WITH MINERALS) TABS tablet Take 1 tablet by mouth 2 (two) times a week.     omeprazole (PRILOSEC) 20 MG capsule TAKE ONE CAPSULE BY MOUTH DAILY 30 capsule 12   PARoxetine (PAXIL) 10 MG tablet TAKE ONE TABLET (10MG  TOTAL) BY MOUTH DAILY 30 tablet 2   trolamine salicylate (ASPERCREME) 10 % cream Apply 1 application topically as needed for muscle pain.     No current facility-administered medications for this visit.    Allergies as of 10/23/2021   (No Known Allergies)    Family History  Problem Relation Age of Onset   Cancer Father        pancreatic   Alcoholism Father    Hypertension Mother     Social History   Socioeconomic History   Marital status: Single    Spouse name: Not on file   Number of children: 0   Years of education: Not on file   Highest education level: Not on file   Occupational History   Not on file  Tobacco Use   Smoking status: Every Day    Packs/day: 1.00    Years: 33.00    Pack years: 33.00    Types: Cigarettes   Smokeless tobacco: Never  Vaping Use   Vaping Use: Never used  Substance and Sexual Activity   Alcohol use: Yes    Comment: 2 times weekly   Drug use: No   Sexual activity: Not Currently    Birth control/protection: Post-menopausal  Other Topics Concern   Not on file  Social History Narrative   Not on file   Social Determinants of Health   Financial Resource Strain: Not on file  Food Insecurity: Not on file  Transportation Needs: Not on file  Physical Activity: Not on file  Stress: Not on file  Social Connections: Not on file    Review of Systems: Gen: Denies fever, chills, anorexia. Denies fatigue, weakness, weight loss.  CV: Denies chest pain, palpitations, syncope, peripheral edema, and claudication. Resp: Denies dyspnea at rest, cough, wheezing, coughing up blood, and pleurisy. GI: Denies vomiting blood, jaundice, and fecal incontinence.   Denies dysphagia or odynophagia. Derm: Denies rash, itching, dry skin Psych: Denies depression, anxiety, memory loss, confusion. No homicidal or suicidal ideation.  Heme: Denies bruising, bleeding, and enlarged lymph nodes.  Physical Exam: There were  no vitals taken for this visit. General:   Alert and oriented. No distress noted. Pleasant and cooperative.  Head:  Normocephalic and atraumatic. Eyes:  Conjuctiva clear without scleral icterus. Mouth:  Oral mucosa pink and moist. Good dentition. No lesions. Heart:  S1, S2 present without murmurs appreciated. Lungs:  Clear to auscultation bilaterally. No wheezes, rales, or rhonchi. No distress.  Abdomen:  +BS, soft, non-tender and non-distended. No rebound or guarding. No HSM or masses noted. Msk:  Symmetrical without gross deformities. Normal posture. Extremities:  Without edema. Neurologic:  Alert and  oriented x4 Psych:   Alert and cooperative. Normal mood and affect.

## 2021-10-23 ENCOUNTER — Encounter: Payer: Self-pay | Admitting: Internal Medicine

## 2021-10-23 ENCOUNTER — Ambulatory Visit: Payer: Self-pay | Admitting: Gastroenterology

## 2021-11-24 ENCOUNTER — Other Ambulatory Visit: Payer: Self-pay | Admitting: Adult Health
